# Patient Record
Sex: Male | Born: 2000 | Race: Black or African American | Hispanic: No | Marital: Single | Smoking: Never smoker
Health system: Southern US, Community
[De-identification: ages and names within clinical notes are randomized; demographics above are authoritative.]

---
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
## 2023-02-24 ENCOUNTER — Encounter: Payer: Self-pay | Admitting: *Deleted

## 2023-02-24 ENCOUNTER — Other Ambulatory Visit: Payer: Self-pay

## 2023-02-24 ENCOUNTER — Ambulatory Visit
Admission: EM | Admit: 2023-02-24 | Discharge: 2023-02-24 | Disposition: A | Discharge status: Home / Self Care | Payer: Self-pay | Attending: Internal Medicine | Admitting: Internal Medicine

## 2023-02-24 DIAGNOSIS — L600 Ingrowing nail: Secondary | ICD-10-CM

## 2023-02-24 MED ORDER — CEPHALEXIN 500 MG PO CAPS: 500.0000 mg | ORAL_CAPSULE | Freq: Four times a day (QID) | ORAL | 0 refills | Status: AC

## 2023-02-24 NOTE — Discharge Instructions (Signed)
You have an infected ingrown toenail so I will prescribe antibiotic to treat this.  Follow-up with the foot doctor at provided phone number.

## 2023-02-24 NOTE — ED Provider Notes (Signed)
[facility]    CSN: [id number] Arrival date & time: 02/24/23  1307      History   Chief Complaint Chief Complaint  Patient presents with   Wound Check    HPI Russell Garza is a 22 y.o. male.   Patient presents with pain surrounding left great toenail that has been present intermittently for the past few years but is worsened over the past 2 weeks.  Reports has been having purulent drainage from the area.  Denies any recent injury.  Denies any fever.   Wound Check    History reviewed. No pertinent past medical history.  There are no problems to display for this patient.   History reviewed. No pertinent surgical history.     Home Medications    Prior to Admission medications   Medication Sig Start Date End Date Taking? Authorizing Provider  cephALEXin (KEFLEX) 500 MG capsule Take 1 capsule (500 mg total) by mouth 4 (four) times daily. 02/24/23  Yes Viki Carrera, Acie Fredrickson, FNP  Dextromethorphan-guaiFENesin (TUSSIN DM COUGH + CHEST PO) Take by mouth.    [provider]  predniSONE (STERAPRED UNI-PAK 21 TAB) 10 MG (21) TBPK tablet Take by mouth daily. Take as directed. 06/12/18   Mardella Layman, MD    Family History History reviewed. No pertinent family history.  Social History Social History   Tobacco Use   Smoking status: Never   Smokeless tobacco: Never  Vaping Use   Vaping status: Never Used  Substance Use Topics   Alcohol use: Never   Drug use: Never     Allergies   Patient has no known allergies.   Review of Systems Review of Systems Per HPI  Physical Exam Triage Vital Signs ED Triage Vitals  Encounter Vitals Group     BP 02/24/23 1335 129/71     Systolic BP Percentile --      Diastolic BP Percentile --      Pulse Rate 02/24/23 1335 67     Resp 02/24/23 1335 18     Temp 02/24/23 1335 98.7 F (37.1 C)     Temp Source 02/24/23 1335 Oral     SpO2 02/24/23 1335 98 %     Weight --      Height --      Head Circumference --       Peak Flow --      Pain Score 02/24/23 1332 0     Pain Loc --      Pain Education --      Exclude from Growth Chart --    No data found.  Updated Vital Signs BP 129/71 (BP Location: Right Arm)   Pulse 67   Temp 98.7 F (37.1 C) (Oral)   Resp 18   SpO2 98%   Visual Acuity Right Eye Distance:   Left Eye Distance:   Bilateral Distance:    Right Eye Near:   Left Eye Near:    Bilateral Near:     Physical Exam Constitutional:      General: He is not in acute distress.    Appearance: Normal appearance. He is not toxic-appearing or diaphoretic.  HENT:     Head: Normocephalic and atraumatic.  Eyes:     Extraocular Movements: Extraocular movements intact.     Conjunctiva/sclera: Conjunctivae normal.  Pulmonary:     Effort: Pulmonary effort is normal.  Feet:     Comments: Patient has mild swelling, erythema, purulent drainage surrounding the skin of the left great  toenail.  Nail is intact.  No obvious area of fluctuance or abscess.  Patient can wiggle toes.  Capillary refill and pulses intact. Neurological:     General: No focal deficit present.     Mental Status: He is alert and oriented to person, place, and time. Mental status is at baseline.  Psychiatric:        Mood and Affect: Mood normal.        Behavior: Behavior normal.        Thought Content: Thought content normal.        Judgment: Judgment normal.      UC Treatments / Results  Labs (all labs ordered are listed, but only abnormal results are displayed) Labs Reviewed - No data to display  EKG   Radiology No results found.  Procedures Procedures (including critical care time)  Medications Ordered in UC Medications - No data to display  Initial Impression / Assessment and Plan / UC Course  I have reviewed the triage vital signs and the nursing notes.  Pertinent labs & imaging results that were available during my care of the patient were reviewed by me and considered in my medical decision making  (see chart for details).     Differential diagnoses include ingrown infected toenail versus paronychia.  No area for drainage that is necessary at this time.  Will treat with cephalexin.  Encouraged follow-up with podiatry at provided phone number as well.  Patient verbalized understanding and was agreeable with plan. Final Clinical Impressions(s) / UC Diagnoses   Final diagnoses:  Ingrowing toenail with infection     Discharge Instructions      You have an infected ingrown toenail so I will prescribe antibiotic to treat this.  Follow-up with the foot doctor at provided phone number.    ED Prescriptions     Medication Sig Dispense Auth. Provider   cephALEXin (KEFLEX) 500 MG capsule Take 1 capsule (500 mg total) by mouth 4 (four) times daily. 28 capsule [place], Acie Fredrickson, [place]      PDMP not reviewed this encounter.   Gustavus Bryant, [place] 02/24/23 1359

## 2023-02-24 NOTE — ED Triage Notes (Signed)
Pt report pain to left great toenail "for years"- worse over last 2 weeks. Drainage noted to side of nail bed
